# Patient Record
Sex: Female | Born: 1945 | Race: Black or African American | Hispanic: No | Smoking: Current some day smoker
Health system: Southern US, Community
[De-identification: ages and names within clinical notes are randomized; demographics above are authoritative.]

## PROBLEM LIST (undated history)

## (undated) DIAGNOSIS — I10 Essential (primary) hypertension: Principal | ICD-10-CM

## (undated) HISTORY — PX: ABDOMINAL HYSTERECTOMY: SHX81

## (undated) HISTORY — DX: Essential (primary) hypertension: I10

---

## 2013-04-11 ENCOUNTER — Ambulatory Visit (INDEPENDENT_AMBULATORY_CARE_PROVIDER_SITE_OTHER): Payer: Medicare Other | Admitting: Family Medicine

## 2013-04-11 ENCOUNTER — Encounter: Payer: Self-pay | Admitting: Family Medicine

## 2013-04-11 VITALS — BP 151/101 | HR 73 | Ht 64.0 in | Wt 158.0 lb

## 2013-04-11 DIAGNOSIS — F17200 Nicotine dependence, unspecified, uncomplicated: Secondary | ICD-10-CM

## 2013-04-11 DIAGNOSIS — E785 Hyperlipidemia, unspecified: Secondary | ICD-10-CM | POA: Insufficient documentation

## 2013-04-11 DIAGNOSIS — Z5181 Encounter for therapeutic drug level monitoring: Secondary | ICD-10-CM

## 2013-04-11 DIAGNOSIS — Z79899 Other long term (current) drug therapy: Secondary | ICD-10-CM

## 2013-04-11 DIAGNOSIS — R350 Frequency of micturition: Secondary | ICD-10-CM | POA: Insufficient documentation

## 2013-04-11 DIAGNOSIS — Z720 Tobacco use: Secondary | ICD-10-CM | POA: Insufficient documentation

## 2013-04-11 DIAGNOSIS — I10 Essential (primary) hypertension: Secondary | ICD-10-CM

## 2013-04-11 DIAGNOSIS — R05 Cough: Secondary | ICD-10-CM

## 2013-04-11 HISTORY — DX: Essential (primary) hypertension: I10

## 2013-04-11 MED ORDER — LISINOPRIL 5 MG PO TABS: 5.0000 mg | ORAL_TABLET | Freq: Every day | ORAL | Status: AC

## 2013-04-11 MED ORDER — TIOTROPIUM BROMIDE MONOHYDRATE 18 MCG IN CAPS: 18.0000 ug | ORAL_CAPSULE | Freq: Every day | RESPIRATORY_TRACT | Status: AC

## 2013-04-11 NOTE — Patient Instructions (Addendum)
Start the Spiriva samples and call me if this helps with your cough so I can send in a prescription to [facility].

## 2013-04-11 NOTE — Progress Notes (Signed)
CC: Melissa Miranda is a 67 y.o. female is here for Establish Care   Subjective: HPI:  Pleasant and animated 67 year old here to establish care  Patient reports a history of hypertension she has been on lisinopril 5 mg in the past she ran out of this medication one week ago. Records from [facility] show normotensive blood pressures while on this medication she has no outside blood pressures to report.  History of hyperlipidemia: She's been taking pravastatin for at least a year now last lipid panel in January had LDL of 150 while on this medication. She denies right upper quadrant pain, myalgias, nor skin or scleral discoloration  Patient complains of a cough that has been present on a daily basis but has not gotten better or worse since onset years ago. Symptoms seem to be worse when pollen counts are high nothing else particularly makes the cough better or worse. It is only present during waking hours it is described as productive without blood in sputum. She has been smoker for over 20 years she's unsure of the exact duration of her smoking habits. Cough did not improve after running out of lisinopril. She denies shortness of breath wheezing or chest pain  Patient complains of urinary frequency that has been present ever since hysterectomy for benign reasons one year ago. Urinary frequency has gotten from mild to moderate over the past week it is also accompanied by urinary urgency but without dysuria nor change in color or odor or consistency of urine. She denies flank pain, fevers, chills  Review of Systems - General ROS: negative for - chills, fever, night sweats, weight gain or weight loss Ophthalmic ROS: negative for - decreased vision Psychological ROS: negative for - anxiety or depression ENT ROS: negative for - hearing change, nasal congestion, tinnitus or allergies Hematological and Lymphatic ROS: negative for - bleeding problems, bruising or swollen lymph nodes Breast ROS:  negative Respiratory ROS: no shortness of breath, or wheezing Cardiovascular ROS: no chest pain or dyspnea on exertion Gastrointestinal ROS: no abdominal pain, change in bowel habits, or black or bloody stools Genito-Urinary ROS: negative for - genital discharge, genital ulcers, incontinence or abnormal bleeding from genitals Musculoskeletal ROS: negative for - joint pain or muscle pain Neurological ROS: negative for - headaches or memory loss Dermatological ROS: negative for lumps, mole changes, rash and skin lesion changes  Past Medical History  Diagnosis Date  . Essential hypertension, benign 04/11/2013     History reviewed. No pertinent family history.   History  Substance Use Topics  . Smoking status: Current Every Day Smoker    Types: Cigarettes  . Smokeless tobacco: Not on file  . Alcohol Use: Not on file     Objective: Filed Vitals:   04/11/13 1046  BP: 151/101  Pulse: 73    General: Alert and Oriented, No Acute Distress HEENT: Pupils equal, round, reactive to light. Conjunctivae clear.  External ears unremarkable, canals clear with intact TMs with appropriate landmarks.  Middle ear appears open without effusion. Pink inferior turbinates.  Moist mucous membranes, pharynx without inflammation nor lesions.  Neck supple without palpable lymphadenopathy nor abnormal masses. Lungs: Clear to auscultation bilaterally, no wheezing/ronchi/rales.  Comfortable work of breathing. Good air movement. Distant breath sounds in all lung fields Cardiac: Regular rate and rhythm. Normal S1/S2.  No murmurs, rubs, nor gallops.   Back: No CVA tenderness Extremities: No peripheral edema.  Strong peripheral pulses.  Mental Status: No depression, anxiety, nor agitation. Skin: Warm and dry.  Assessment & Plan: [name] was seen today for establish care.  Diagnoses and associated orders for this visit:  Essential hypertension, benign - lisinopril (PRINIVIL,ZESTRIL) 5 MG tablet; Take 1 tablet  (5 mg total) by mouth daily.  Hyperlipidemia - Lipid panel  Tobacco use  Urinary frequency - Urinalysis, Routine w reflex microscopic - Urine culture  Encounter for monitoring statin therapy - COMPLETE METABOLIC PANEL WITH GFR  Cough - tiotropium (SPIRIVA HANDIHALER) 18 MCG inhalation capsule; Place 1 capsule (18 mcg total) into inhaler and inhale daily.    Essential hypertension: Uncontrolled chronic condition restart lisinopril with blood pressure check in 2-4 weeks checking renal function today Hyperlipidemia: Uncontrolled based on last LDL in January repeat LDL fasting today we'll titrate Pravachol if needed, checking hepatic enzymes Tobacco use: Counseled patient on smoking cessation Urinary frequency: Rule out UTI versus glycosuria Cough: Low suspicion of infectious etiology, likely due to chronic bronchitis due to smoking samples of Spiriva given  Return in about 4 weeks (around 05/09/2013).

## 2013-04-12 ENCOUNTER — Telehealth: Payer: Self-pay | Admitting: Family Medicine

## 2013-04-12 DIAGNOSIS — E785 Hyperlipidemia, unspecified: Secondary | ICD-10-CM

## 2013-04-12 DIAGNOSIS — R739 Hyperglycemia, unspecified: Secondary | ICD-10-CM

## 2013-04-12 LAB — COMPLETE METABOLIC PANEL WITH GFR
AST: 21 U/L (ref 0–37)
Albumin: 4.2 g/dL (ref 3.5–5.2)
Alkaline Phosphatase: 89 U/L (ref 39–117)
BUN: 7 mg/dL (ref 6–23)
GFR, Est Non African American: 63 mL/min
Glucose, Bld: 101 mg/dL — ABNORMAL HIGH (ref 70–99)
Potassium: 4.3 mEq/L (ref 3.5–5.3)
Total Bilirubin: 0.6 mg/dL (ref 0.3–1.2)

## 2013-04-12 LAB — URINALYSIS, ROUTINE W REFLEX MICROSCOPIC
Bilirubin Urine: NEGATIVE
Hgb urine dipstick: NEGATIVE
Ketones, ur: NEGATIVE mg/dL
Nitrite: NEGATIVE
Protein, ur: NEGATIVE mg/dL
Urobilinogen, UA: 0.2 mg/dL (ref 0.0–1.0)
pH: 5 (ref 5.0–8.0)

## 2013-04-12 LAB — URINE CULTURE: Organism ID, Bacteria: NO GROWTH

## 2013-04-12 LAB — LIPID PANEL
Cholesterol: 186 mg/dL (ref 0–200)
HDL: 48 mg/dL (ref >39)
LDL Cholesterol: 111 mg/dL — ABNORMAL HIGH (ref 0–99)
Total CHOL/HDL Ratio: 3.9 Ratio
VLDL: 27 mg/dL (ref 0–40)

## 2013-04-12 LAB — URINALYSIS, MICROSCOPIC ONLY
Casts: NONE SEEN
Crystals: NONE SEEN

## 2013-04-12 MED ORDER — PRAVASTATIN SODIUM 80 MG PO TABS: 80.0000 mg | ORAL_TABLET | Freq: Every day | ORAL | Status: AC

## 2013-04-12 NOTE — Telephone Encounter (Signed)
Sue Lush, Will you please let Melissa Miranda know that her urinanalysis was normal however we won't have results about the culture until later this week.  Cholesterol was above a goal of an LDL cholesterol of less than 100 so I've sent in an increased dose of pravastatin to her [facility], we'll want to recheck cholesterol in three months.  Liver function and kidney function were normal, fasting blood sugar was just barely elevated, something that we'll check on periodically at follow up. I'd encourage her to f/u in 4 weeks for blood pressure recheck.

## 2013-04-12 NOTE — Telephone Encounter (Signed)
Pt.notified

## 2013-04-13 ENCOUNTER — Telehealth: Payer: Self-pay | Admitting: Family Medicine

## 2013-04-13 DIAGNOSIS — N3281 Overactive bladder: Secondary | ICD-10-CM

## 2013-04-13 MED ORDER — MIRABEGRON ER 25 MG PO TB24: 25.0000 mg | ORAL_TABLET | Freq: Every day | ORAL | Status: DC

## 2013-04-13 NOTE — Telephone Encounter (Signed)
Samples left up front and left message on pts vm

## 2013-04-13 NOTE — Telephone Encounter (Signed)
Melissa Miranda, Myrbetriq samples placed in your inbox for patient.

## 2013-05-10 ENCOUNTER — Ambulatory Visit (INDEPENDENT_AMBULATORY_CARE_PROVIDER_SITE_OTHER): Payer: Medicare Other | Admitting: Family Medicine

## 2013-05-10 ENCOUNTER — Encounter: Payer: Self-pay | Admitting: Family Medicine

## 2013-05-10 VITALS — BP 141/76 | HR 67 | Ht 64.0 in | Wt 159.0 lb

## 2013-05-10 DIAGNOSIS — Z8619 Personal history of other infectious and parasitic diseases: Secondary | ICD-10-CM | POA: Insufficient documentation

## 2013-05-10 DIAGNOSIS — Z13820 Encounter for screening for osteoporosis: Secondary | ICD-10-CM

## 2013-05-10 DIAGNOSIS — Z1239 Encounter for other screening for malignant neoplasm of breast: Secondary | ICD-10-CM

## 2013-05-10 DIAGNOSIS — R5383 Other fatigue: Secondary | ICD-10-CM

## 2013-05-10 DIAGNOSIS — Z0000 Encounter for general adult medical examination without abnormal findings: Secondary | ICD-10-CM

## 2013-05-10 DIAGNOSIS — R55 Syncope and collapse: Secondary | ICD-10-CM

## 2013-05-10 DIAGNOSIS — Z124 Encounter for screening for malignant neoplasm of cervix: Secondary | ICD-10-CM

## 2013-05-10 DIAGNOSIS — Z1211 Encounter for screening for malignant neoplasm of colon: Secondary | ICD-10-CM

## 2013-05-10 MED ORDER — ESTROGENS CONJUGATED 0.3 MG PO TABS: ORAL_TABLET | ORAL | Status: DC

## 2013-05-10 NOTE — Progress Notes (Signed)
Subjective:    Melissa Miranda is a 67 y.o. female who presents for Medicare Annual/Subsequent preventive examination.  Preventive Screening-Counseling & Management  Tobacco History  Smoking status  . Current Every Day Smoker  . Types: Cigarettes  Smokeless tobacco  . Not on file    Colonoscopy: Believes this was normal in 2013, 10 year clearance Papsmear: GYN told her she never needs this again since hysterectomy  Mammogram: Overdue for annual  DEXA: due for 2 year f/u unknown what last results were  Influenza Vaccine: declines Pneumovax: declines Td/Tdap: declines Zoster: declines      Problems Prior to Visit 1. Complaining of hot flashes most days of the week interefering with quality of life.  No history of blood clots.  Also complaining of fatigue for the past 2 months on a daily basis.  Current Problems (verified) Patient Active Problem List   Diagnosis Date Noted  . History of shingles 05/10/2013  . 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 Overactive bladder 04/13/2013  . Hyperglycemia 04/12/2013  . Cough 04/11/2013  . Essential hypertension, benign 04/11/2013  . Hyperlipidemia 04/11/2013  . Tobacco use 04/11/2013  . Urinary frequency 04/11/2013    Medications Prior to Visit Current Outpatient Prescriptions on File Prior to Visit  Medication Sig Dispense Refill  . Calcium Carbonate-Vitamin D (CALTRATE 600+D PO) Take by mouth.      Marland Kitchen lisinopril (PRINIVIL,ZESTRIL) 5 MG tablet Take 1 tablet (5 mg total) by mouth daily.  90 tablet  1  . mirabegron ER (MYRBETRIQ) 25 MG TB24 tablet Take 1 tablet (25 mg total) by mouth daily.  21 tablet  0  . pravastatin (PRAVACHOL) 80 MG tablet Take 1 tablet (80 mg total) by mouth daily.  30 tablet  3  . tiotropium (SPIRIVA HANDIHALER) 18 MCG inhalation capsule Place 1 capsule (18 mcg total) into inhaler and inhale daily.  15 capsule  0   No current facility-administered medications on file prior to visit.    Current Medications  (verified) Current Outpatient Prescriptions  Medication Sig Dispense Refill  . Calcium Carbonate-Vitamin D (CALTRATE 600+D PO) Take by mouth.      Marland Kitchen lisinopril (PRINIVIL,ZESTRIL) 5 MG tablet Take 1 tablet (5 mg total) by mouth daily.  90 tablet  1  . mirabegron ER (MYRBETRIQ) 25 MG TB24 tablet Take 1 tablet (25 mg total) by mouth daily.  21 tablet  0  . pravastatin (PRAVACHOL) 80 MG tablet Take 1 tablet (80 mg total) by mouth daily.  30 tablet  3  . tiotropium (SPIRIVA HANDIHALER) 18 MCG inhalation capsule Place 1 capsule (18 mcg total) into inhaler and inhale daily.  15 capsule  0  . estrogens, conjugated, (PREMARIN) 0.3 MG tablet One by mouth daily to control hot flashes.  30 tablet  4   No current facility-administered medications for this visit.     Allergies (verified) Review of patient's allergies indicates no known allergies.   PAST HISTORY  Family History Family History  Problem Relation Age of Onset  . Hypertension Father     Social History History  Substance Use Topics  . Smoking status: Current Every Day Smoker    Types: Cigarettes  . Smokeless tobacco: Not on file  . Alcohol Use: Not on file     Are there smokers in your home (other than you)? No  Risk Factors Current exercise habits: The patient does not participate in regular exercise at present.  Dietary issues discussed: DASH diet   Cardiac risk factors: advanced age (older  than 55 for men, 65 for women), dyslipidemia, hypertension, sedentary lifestyle and smoking/ tobacco exposure.  Depression Screen (Note: if answer to either of the following is "Yes", a more complete depression screening is indicated)   Over the past two weeks, have you felt down, depressed or hopeless? No  Over the past two weeks, have you felt little interest or pleasure in doing things? No  Have you lost interest or pleasure in daily life? No  Do you often feel hopeless? No  Do you cry easily over simple problems? No  Activities  of Daily Living In your present state of health, do you have any difficulty performing the following activities?:  Driving? No Managing money?  No Feeding yourself? No Getting from bed to chair? no Climbing a flight of stairs? No Preparing food and eating?: No Bathing or showering? No Getting dressed: No Getting to the toilet? No Using the toilet:No Moving around from place to place: No In the past year have you fallen or had a near fall?:No   Are you sexually active?  No  Do you have more than one partner?  No  Hearing Difficulties: No Do you often ask people to speak up or repeat themselves? No Do you experience ringing or noises in your ears? No Do you have difficulty understanding soft or whispered voices? No   Do you feel that you have a problem with memory? No  Do you often misplace items? No  Do you feel safe at home?  Yes  Cognitive Testing  Alert? Yes  Normal Appearance?Yes  Oriented to person? Yes  Place? Yes   Time? Yes  Recall of three objects?  Yes  Can perform simple calculations? Yes  Displays appropriate judgment?Yes  Can read the correct time from a watch face?Yes   Advanced Directives have been discussed with the patient? Yes  List the Names of Other Physician/Practitioners you currently use: 1.  none  Indicate any recent Medical Services you may have received from other than [facility] providers in the past year (date may be approximate).   There is no immunization history on file for this patient.  Screening Tests Health Maintenance  Topic Date Due  . Tetanus/tdap  11/30/1965  . Mammogram  11/30/1996  . Colonoscopy  11/30/1996  . Zostavax  12/01/2006  . Pneumococcal Polysaccharide Vaccine Age 66 And Over  12/01/2011  . Influenza Vaccine  02/11/2013    All answers were reviewed with the patient and necessary referrals were made:  Laren Boom, DO   05/10/2013   History reviewed: allergies, current medications, past family history, past medical  history, past social history, past surgical history and problem list  Review of Systems Review of Systems - General ROS: negative for - night sweats, weight gain or weight loss Ophthalmic ROS: negative for - decreased vision Psychological ROS: negative for - anxiety or depression ENT ROS: negative for - hearing change, nasal congestion, tinnitus or allergies Hematological and Lymphatic ROS: negative for - bleeding problems, bruising or swollen lymph nodes Breast ROS: negative Respiratory ROS: no cough, shortness of breath, or wheezing Cardiovascular ROS: no chest pain or dyspnea on exertion Gastrointestinal ROS: no abdominal pain, change in bowel habits, or black or bloody stools Genito-Urinary ROS: negative for - genital discharge, genital ulcers, incontinence or abnormal bleeding from genitals Musculoskeletal ROS: negative for - joint pain or muscle pain Neurological ROS: negative for - headaches or memory loss Dermatological ROS: negative for lumps, mole changes, rash and skin lesion changes  Objective:     Vision by Snellen chart: right eye:20/30, left eye:20/40  Body mass index is 27.28 kg/(m^2). BP 141/76  Pulse 67  Ht 5\' 4"  (1.626 m)  Wt 159 lb (72.122 kg)  BMI 27.28 kg/m2  Vital signs reviewed. General: Alert and Oriented, No Acute Distress HEENT: Pupils equal, round, reactive to light. Conjunctivae clear.  External ears unremarkable.  Moist mucous membranes. Lungs: Clear and comfortable work of breathing, speaking in full sentences without accessory muscle use. Cardiac: Regular rate and rhythm.  Neuro: CN II-XII grossly intact, gait normal. Extremities: Noand and peripheral edema.  Strong peripheral pulses.  Mental Status: No depression, anxiety, nor agitation. Logical though process. Skin: Warm and dry.     Assessment:     Vasomotor instability and mild vision loss      Plan:     During the course of the visit the patient was educated and counseled about  appropriate screening and preventive services including:    Pneumococcal vaccine   Influenza vaccine  Td vaccine  Screening mammography  Screening Pap smear and pelvic exam   Bone densitometry screening  Colorectal cancer screening  Nutrition counseling   Diet review for nutrition referral? No  Patient Instructions (the written plan) was given to the patient.  Medicare Attestation I have personally reviewed: The patient's medical and social history Their use of alcohol, tobacco or illicit drugs Their current medications and supplements The patient's functional ability including ADLs,fall risks, home safety risks, cognitive, and hearing and visual impairment Diet and physical activities Evidence for depression or mood disorders  The patient's weight, height, BMI, and visual acuity have been recorded in the chart.  I have made referrals, counseling, and provided education to the patient based on review of the above and I have provided the patient with a written personalized care plan for preventive services.    Start Premarin and checking TSH today, encouraged followup with optometry for formal vision testing  Return in 2 months  Laren Boom, DO   05/10/2013

## 2013-05-10 NOTE — Patient Instructions (Signed)
Dr. Meko Bellanger's General Advice Following Your Complete Physical Exam  The Benefits of Regular Exercise: Unless you suffer from an uncontrolled cardiovascular condition, studies strongly suggest that regular exercise and physical activity will add to both the quality and length of your life.  The World Health Organization recommends 150 minutes of moderate intensity aerobic activity every week.  This is best split over 3-4 days a week, and can be as simple as a brisk walk for just over 35 minutes "most days of the week".  This type of exercise has been shown to lower LDL-Cholesterol, lower average blood sugars, lower blood pressure, lower cardiovascular disease risk, improve memory, and increase one's overall sense of wellbeing.  The addition of anaerobic (or "strength training") exercises offers additional benefits including but not limited to increased metabolism, prevention of osteoporosis, and improved overall cholesterol levels.  How Can I Strive For A Low-Fat Diet?: Current guidelines recommend that 25-35 percent of your daily energy (food) intake should come from fats.  One might ask how can this be achieved without having to dissect each meal on a daily basis?  Switch to skim or 1% milk instead of whole milk.  Focus on lean meats such as ground turkey, fresh fish, baked chicken, and lean cuts of beef as your source of dietary protein.  Consume less than 300mg/day of dietary cholesterol.  Limit trans fatty acid consumption primarily by limiting synthetic trans fats such as partially hydrogenated oils (Ex: fried fast foods).  Focus efforts on reducing your intake of "solid" fats (Ex: Butter).  Substitute olive or vegetable oil for solid fats where possible.  Moderation of Salt Intake: Provided you don't carry a diagnosis of congestive heart failure nor renal failure, I recommend a daily allowance of no more than 2300 mg of salt (sodium).  Keeping under this daily goal is associated with a  decreased risk of cardiovascular events, creeping above it can lead to elevated blood pressures and increases your risk of cardiovascular events.  Milligrams (mg) of salt is listed on all nutrition labels, and your daily intake can add up faster than you think.  Most canned and frozen dinners can pack in over half your daily salt allowance in one meal.    Lifestyle Health Risks: Certain lifestyle choices carry specific health risks.  As you may already know, tobacco use has been associated with increasing one's risk of cardiovascular disease, pulmonary disease, numerous cancers, among many other issues.  What you may not know is that there are medications and nicotine replacement strategies that can more than double your chances of successfully quitting.  I would be thrilled to help manage your quitting strategy if you currently use tobacco products.  When it comes to alcohol use, I've yet to find an "ideal" daily allowance.  Provided an individual does not have a medical condition that is exacerbated by alcohol consumption, general guidelines determine "safe drinking" as no more than two standard drinks for a man or no more than one standard drink for a female per day.  However, much debate still exists on whether any amount of alcohol consumption is technically "safe".  My general advice, keep alcohol consumption to a minimum for general health promotion.  If you or others believe that alcohol, tobacco, or recreational drug use is interfering with your life, I would be happy to provide confidential counseling regarding treatment options.  General "Over The Counter" Nutrition Advice: Postmenopausal women should aim for a daily calcium intake of 1200 mg, however a significant   portion of this might already be provided by diets including milk, yogurt, cheese, and other dairy products.  Vitamin D has been shown to help preserve bone density, prevent fatigue, and has even been shown to help reduce falls in the  elderly.  Ensuring a daily intake of 800 Units of Vitamin D is a good place to start to enjoy the above benefits, we can easily check your Vitamin D level to see if you'd potentially benefit from supplementation beyond 800 Units a day.  Folic Acid intake should be of particular concern to women of childbearing age.  Daily consumption of 400-800 mcg of Folic Acid is recommended to minimize the chance of spinal cord defects in a fetus should pregnancy occur.    For many adults, accidents still remain one of the most common culprits when it comes to cause of death.  Some of the simplest but most effective preventitive habits you can adopt include regular seatbelt use, proper helmet use, securing firearms, and regularly testing your smoke and carbon monoxide detectors.  Melissa Miranda B. Melissa Dant DO [facility] [address], [address] [place], [place] [address] Phone: [phone]  

## 2013-05-11 LAB — TSH: TSH: 0.665 u[IU]/mL (ref 0.350–4.500)

## 2013-05-24 ENCOUNTER — Ambulatory Visit (INDEPENDENT_AMBULATORY_CARE_PROVIDER_SITE_OTHER): Payer: Medicare Other

## 2013-05-24 DIAGNOSIS — M899 Disorder of bone, unspecified: Secondary | ICD-10-CM

## 2013-05-24 DIAGNOSIS — Z124 Encounter for screening for malignant neoplasm of cervix: Secondary | ICD-10-CM

## 2013-05-24 DIAGNOSIS — Z13820 Encounter for screening for osteoporosis: Secondary | ICD-10-CM

## 2013-05-24 DIAGNOSIS — Z1231 Encounter for screening mammogram for malignant neoplasm of breast: Secondary | ICD-10-CM

## 2013-05-25 ENCOUNTER — Encounter: Payer: Self-pay | Admitting: Family Medicine

## 2013-05-25 DIAGNOSIS — M8580 Other specified disorders of bone density and structure, unspecified site: Secondary | ICD-10-CM | POA: Insufficient documentation

## 2013-11-15 ENCOUNTER — Ambulatory Visit: Payer: Medicare Other | Admitting: Family Medicine

## 2013-11-18 ENCOUNTER — Ambulatory Visit (INDEPENDENT_AMBULATORY_CARE_PROVIDER_SITE_OTHER): Payer: Medicare Other

## 2013-11-18 ENCOUNTER — Ambulatory Visit (INDEPENDENT_AMBULATORY_CARE_PROVIDER_SITE_OTHER): Payer: Medicare Other | Admitting: Family Medicine

## 2013-11-18 ENCOUNTER — Other Ambulatory Visit: Payer: Self-pay | Admitting: Family Medicine

## 2013-11-18 ENCOUNTER — Encounter: Payer: Self-pay | Admitting: Family Medicine

## 2013-11-18 VITALS — BP 121/56 | HR 101 | Wt 136.0 lb

## 2013-11-18 DIAGNOSIS — J302 Other seasonal allergic rhinitis: Secondary | ICD-10-CM

## 2013-11-18 DIAGNOSIS — J309 Allergic rhinitis, unspecified: Secondary | ICD-10-CM

## 2013-11-18 DIAGNOSIS — R05 Cough: Secondary | ICD-10-CM

## 2013-11-18 DIAGNOSIS — R0789 Other chest pain: Secondary | ICD-10-CM

## 2013-11-18 DIAGNOSIS — IMO0001 Reserved for inherently not codable concepts without codable children: Secondary | ICD-10-CM

## 2013-11-18 DIAGNOSIS — R0602 Shortness of breath: Secondary | ICD-10-CM

## 2013-11-18 DIAGNOSIS — N318 Other neuromuscular dysfunction of bladder: Secondary | ICD-10-CM

## 2013-11-18 DIAGNOSIS — R059 Cough, unspecified: Secondary | ICD-10-CM

## 2013-11-18 DIAGNOSIS — N3281 Overactive bladder: Secondary | ICD-10-CM

## 2013-11-18 DIAGNOSIS — R1110 Vomiting, unspecified: Secondary | ICD-10-CM

## 2013-11-18 MED ORDER — CETIRIZINE HCL 10 MG PO TABS: 10.0000 mg | ORAL_TABLET | Freq: Every day | ORAL | Status: AC

## 2013-11-18 NOTE — Progress Notes (Signed)
CC: Melissa Miranda is a 68 y.o. female is here for swallowing issues   Subjective: HPI:  Complains of cough that has been present off and on for about a year now. Symptoms have been getting worse since September. Described as productive without blood and sputum present all hours of the day and interfering with sleep. Slightly improved with Spiriva, no benefit from over-the-counter cough medicines. No other interventions. Accompanied by shortness of breath with exertion or when she gets in arguments. She's also lost about 15 pounds unintentionally since I saw her last. She continues to smoke however it sounds like only one cigarette a day now. She denies fevers, chills, chest pain, orthopnea, peripheral edema.  Complains of regurgitation further described as food getting caught in her throat. She has a history of esophageal stricture that require stretching years ago. Symptoms are mild to moderate in severity present with solids and liquids. Nothing particularly makes better or worse. Symptoms occur on a daily basis. Denies pain with swallowing.  Complains of nasal congestion and facial pressure localized beneath both eyes and has been present off and on for the past month. Accompanied by subjective postnasal drip.  No interventions as of yet. Denies headache, motor sensory disturbances, nor vision loss.  Complains of continued frequent urination described as frequency and urgency without dysuria symptoms have not gotten better or worse since trying mybetriq.  She's requesting referral to urology  Review Of Systems Outlined In HPI  Past Medical History  Diagnosis Date  . Essential hypertension, benign 04/11/2013    Past Surgical History  Procedure Laterality Date  . Abdominal hysterectomy     Family History  Problem Relation Age of Onset  . Hypertension Father     History   Social History  . Marital Status: Unknown    Spouse Name: N/A    Number of Children: N/A  . Years of Education:  N/A   Occupational History  . Not on file.   Social History Main Topics  . Smoking status: Current Some Day Smoker    Types: Cigarettes  . Smokeless tobacco: Not on file  . Alcohol Use: Not on file  . Drug Use: Not on file  . Sexual Activity: Not on file   Other Topics Concern  . Not on file   Social History Narrative  . No narrative on file     Objective: BP 121/56  Pulse 101  Wt 136 lb (61.689 kg)  SpO2 96%  General: Alert and Oriented, No Acute Distress HEENT: Pupils equal, round, reactive to light. Conjunctivae clear.  External ears unremarkable, canals clear with intact TMs with appropriate landmarks.  Middle ear appears open without effusion. Pink inferior turbinates.  Moist mucous membranes, pharynx without inflammation nor lesions.  Neck supple without palpable lymphadenopathy nor abnormal masses. Lungs: Distant breath sounds Clear to auscultation bilaterally, no wheezing/ronchi/rales.  Comfortable work of breathing. Good air movement. Cardiac: Regular rate and rhythm. Normal S1/S2.  No murmurs, rubs, nor gallops.   Mental Status: No depression, anxiety, nor agitation. Skin: Warm and dry.  Assessment & Plan: Melissa Miranda was seen today for swallowing issues.  Diagnoses and associated orders for this visit:  Seasonal allergies - cetirizine (ZYRTEC) 10 MG tablet; Take 1 tablet (10 mg total) by mouth daily.  Cough - DG Chest 2 View; Future  Shortness of breath  Regurgitation - DG Esophagus; Future  Overactive bladder - Ambulatory referral to Urology    Seasonal allergies: Start Zyrtec as needed Cough: Checking chest x-ray, concerning  giving her unintentional weight loss. If unremarkable we'll treat his chronic bronchitis Regurgitation: She needs an esophagram, swallow study has been ordered Overactive bladder: Urology referral    Return if symptoms worsen or fail to improve.

## 2013-11-23 ENCOUNTER — Ambulatory Visit (INDEPENDENT_AMBULATORY_CARE_PROVIDER_SITE_OTHER): Payer: Medicare Other | Admitting: Family Medicine

## 2013-11-23 ENCOUNTER — Encounter: Payer: Self-pay | Admitting: Family Medicine

## 2013-11-23 VITALS — BP 132/71 | HR 96 | Wt 136.0 lb

## 2013-11-23 DIAGNOSIS — R053 Chronic cough: Secondary | ICD-10-CM

## 2013-11-23 DIAGNOSIS — R05 Cough: Secondary | ICD-10-CM

## 2013-11-23 DIAGNOSIS — J449 Chronic obstructive pulmonary disease, unspecified: Secondary | ICD-10-CM

## 2013-11-23 MED ORDER — BUDESONIDE-FORMOTEROL FUMARATE 160-4.5 MCG/ACT IN AERO
2.0000 | INHALATION_SPRAY | Freq: Two times a day (BID) | RESPIRATORY_TRACT | Status: AC
Start: 2013-11-23 — End: ?

## 2013-11-23 MED ORDER — ALBUTEROL SULFATE HFA 108 (90 BASE) MCG/ACT IN AERS: INHALATION_SPRAY | RESPIRATORY_TRACT | Status: AC

## 2013-11-23 NOTE — Progress Notes (Signed)
CC: Melissa Miranda is a 67 y.o. female is here for No chief complaint on file.   Subjective: HPI:  Followup of chronic cough: Patient states she has had a productive cough that has been present for matter of decades. Describes as mild in severity at rest but moderate in severity when she gets anxious or has to talk quickly. Symptoms are present all hours of the day but denies nocturnal symptoms awakening from sleep. Has not been accompanied by fevers, chills, wheezing, nor blood and sputum. Symptoms overall have been getting worse since I met her in September of 2014.  Has been accompanied by mild shortness of breath only with exertion.  Denies chest pain, irregular heartbeat, peripheral edema, nor motor or sensory disturbances. Interventions have included Spiriva with mild improvement, great subjective improvement from albuterol.   Review Of Systems Outlined In HPI  Past Medical History  Diagnosis Date  . Essential hypertension, benign 04/11/2013    Past Surgical History  Procedure Laterality Date  . Abdominal hysterectomy     Family History  Problem Relation Age of Onset  . Hypertension Father     History   Social History  . Marital Status: Unknown    Spouse Name: N/A    Number of Children: N/A  . Years of Education: N/A   Occupational History  . Not on file.   Social History Main Topics  . Smoking status: Current Some Day Smoker    Types: Cigarettes  . Smokeless tobacco: Not on file  . Alcohol Use: Not on file  . Drug Use: Not on file  . Sexual Activity: Not on file   Other Topics Concern  . Not on file   Social History Narrative  . No narrative on file     Objective: BP 132/71  Pulse 96  Wt 136 lb (61.689 kg)  SpO2 100%  General: Alert and Oriented, No Acute Distress HEENT: Pupils equal, round, reactive to light. Conjunctivae clear.  Lungs: Clear to auscultation bilaterally, no wheezing/ronchi/rales.  Comfortable work of breathing. Good air  movement. Cardiac: Regular rate and rhythm. Normal S1/S2.  No murmurs, rubs, nor gallops.   Extremities: No peripheral edema.  Strong peripheral pulses.  Mental Status: No depression, anxiety, nor agitation. Skin: Warm and dry.  Assessment & Plan: Melissa Miranda was seen today for no specified reason.  Diagnoses and associated orders for this visit:  COPD, moderate - albuterol (PROVENTIL HFA;VENTOLIN HFA) 108 (90 BASE) MCG/ACT inhaler; Inhale two puffs every 4-6 hours only as needed for shortness of breath or wheezing. - budesonide-formoterol (SYMBICORT) 160-4.5 MCG/ACT inhaler; Inhale 2 puffs into the lungs 2 (two) times daily.  Chronic cough    Prebronchodilator FEV1 percent 64% FVC 2.83, FEV1 1.8 Postbronchodilator FEV1 percent 80%, FVC 2.75, FEV1 2.28  FEV1 is below goal criteria of less than 70% and FEV1 increases greater than 200 mL after bronchodilation. Classified as moderate COPD with symptoms still persistent despite Spiriva and albuterol therefore start Symbicort, one-month sample was provided along with refills. I've asked her to followup with me in one month encouraged her to quit smoking.  25 minutes spent face-to-face during visit today of which at least 50% was counseling or coordinating care regarding: 1. COPD, moderate   2. Chronic cough       Return in about 4 weeks (around 12/21/2013).  

## 2013-12-09 ENCOUNTER — Encounter: Payer: Self-pay | Admitting: Family Medicine

## 2013-12-13 ENCOUNTER — Telehealth: Payer: Self-pay | Admitting: Family Medicine

## 2013-12-13 NOTE — Telephone Encounter (Signed)
Opened for care everywhere 

## 2014-01-11 DEATH — deceased

## 2014-01-23 ENCOUNTER — Telehealth: Payer: Self-pay

## 2014-01-23 NOTE — Telephone Encounter (Signed)
[facility] Ileene [facility]insonbit[facility]

## 2014-03-06 ENCOUNTER — Telehealth: Payer: Self-pay | Admitting: Family Medicine

## 2014-03-06 NOTE — Telephone Encounter (Signed)
Opened to complete [employer])

## 2015-02-08 IMAGING — DX DG DXA BONE DENSITY STUDY HL7
2 series · 2 of 2 positions shown · non-contrast
Comparison: None.

CLINICAL DATA: Postmenopausal. 67-year-old postmenopausal Caucasian
female. No personal fragility fracture history.

EXAM:
DUAL X-RAY ABSORPTIOMETRY (DXA) FOR BONE MINERAL DENSITY

[view not recorded (1 of 2)]
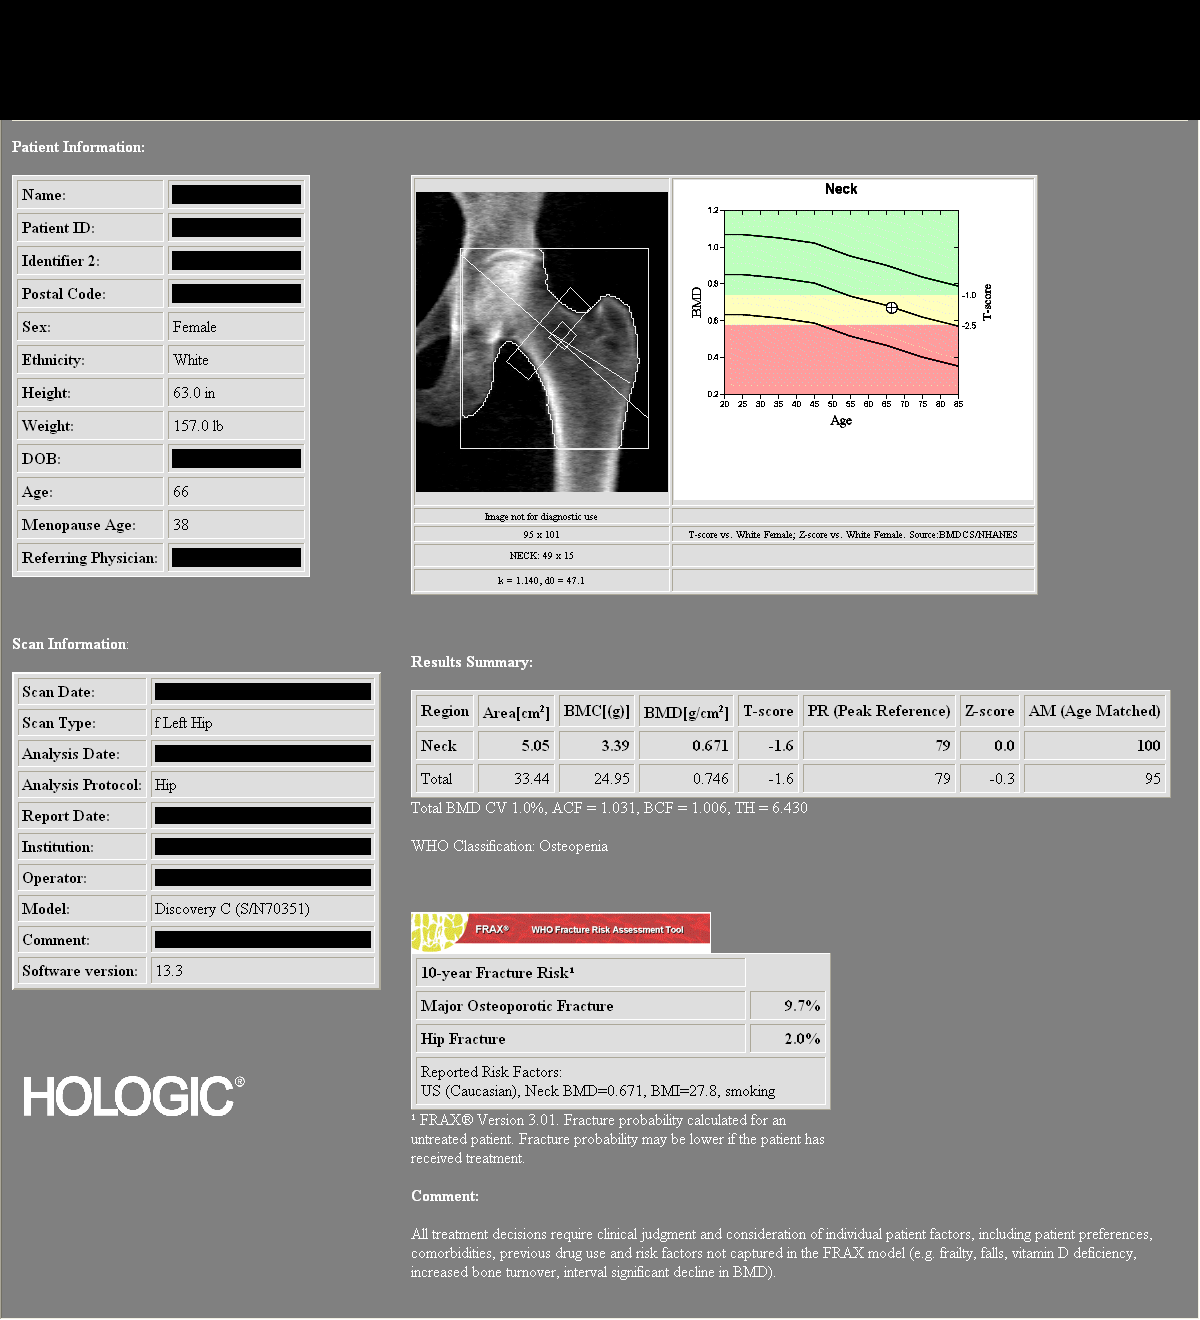

[view not recorded (2 of 2)]
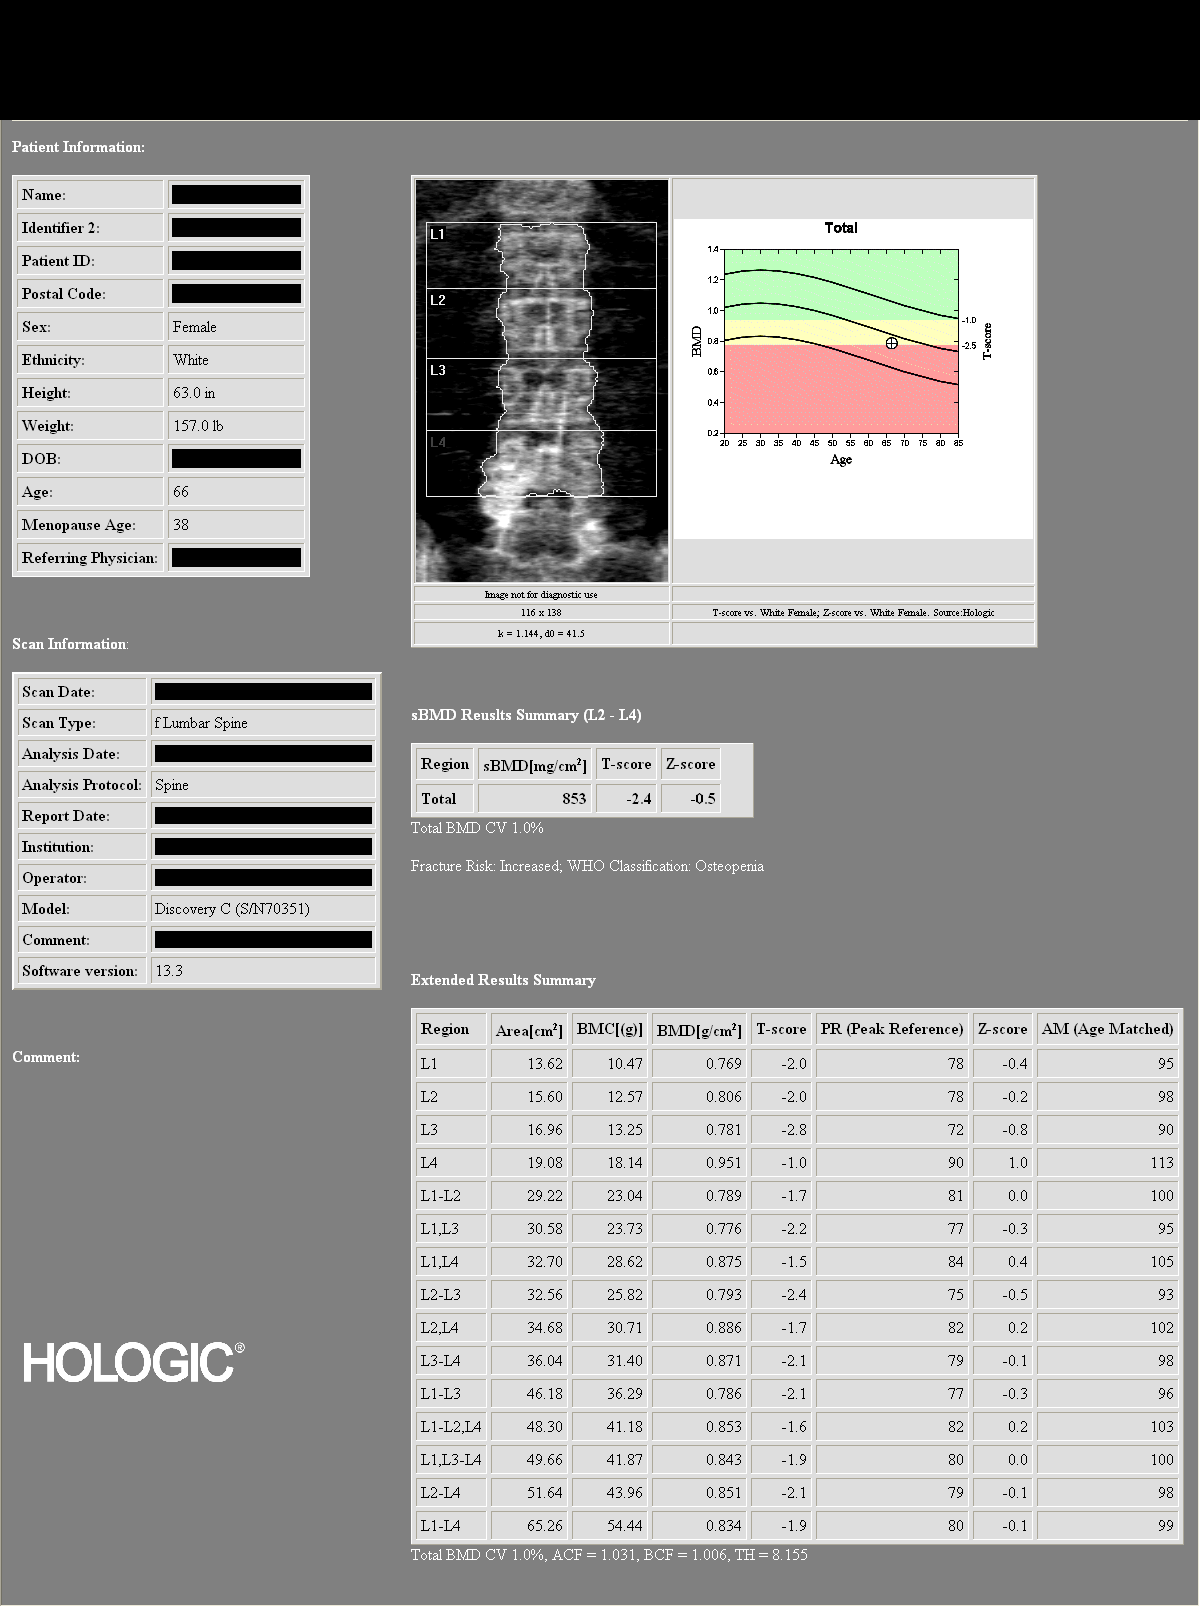

[2 of 2 positions shown; findings below may reference images not displayed]

FINDINGS: AP LUMBAR SPINE

Bone Mineral Density (BMD):  0.853 g/cm2

Young Adult T Score:  -2.4

Z Score:  -0.5

Left FEMUR neck

Bone Mineral Density (BMD):  0.671 g/cm2

Young Adult T Score: -1.6

Z Score:

ASSESSMENT: Patient's diagnostic category is low bone mass by WHO
Criteria.

FRACTURE RISK: Increased

FRAX:

Based on the World Health Organization FRAX model, the 10 year
probability of a major osteoporotic fracture is 9.7%. The 10 year
probability of a hip fracture is 2.0%.

RECOMMENDATIONS:
Effective therapies are available in the form of bisphosphonates,
selective estrogen receptor modulators, biologic agents, and hormone
replacement therapy (for women). All patients should ensure an
adequate intake of dietary calcium (1200mg daily) and vitamin D (800
Aiv Ising) unless contraindicated.

All treatment decisions require clinical judgement and consideration
of individual patient factors, including patient preferences,
co-morbidities, previous drug use, risk factors not captured in the
FRAX model (e.g., frailty, falls, vitamin D deficiency, increased
bone turnover, interval significant decline in bone density) and
possible under-or over-estimation of fracture risk by FRAX.

The National Osteoporosis Foundation recommends that FDA-approved
medical

therapies be considered in postmenopausal women and mean age 50 or
older with a:

Effective therapies are available in the form of bisphosphonates,
selective estrogen receptor modulators, biologic agents, and hormone
replacement therapy (for women). All patients should ensure an
adequate intake of dietary calcium (1200mg daily) and vitamin D (800
Aiv Ising) unless contraindicated.

All treatment decisions require clinical judgement and consideration
of individual patient factors, including patient preferences,
co-morbidities, previous drug use, risk factors not captured in the
FRAX model (e.g., frailty, falls, vitamin D deficiency, increased
bone turnover, interval significant decline in bone density) and
possible under-or over-estimation of fracture risk by FRAX.

The National Osteoporosis Foundation recommends that FDA-approved
medical

therapies be considered in postmenopausal women and mean age 50 or
older with a:

1. Hip or vertebral (clinical or morphometric) fracture.

2. T-score of -2.5 or lower at the spine or hip.

3. Ten-year fracture probability by FRAX of 3% or greater for hip
fracture or 20% or greater for major osteoporotic fracture.

FOLLOW UP:  People with diagnosed cases of osteoporosis or at high
risk for fracture should have regular bone mineral density tests.
For patients eligible for Medicare, routine testing is allowed once
every 2 years. The testing frequency can be increased to one year
for patients who have rapidly progressing disease, those who are
receiving or discontinuing medical therapy to restore bone mass, or
have additional risk factors.

World Health Organization (WHO) Criteria:

Normal: T scores from +1.0 to -1.0

Low Bone Mass (Osteopenia): T scores between -1.0 and -2.5

Osteoporosis: T scores -2.5 and below

Comparison to Reference Population:

T score is the key measure used in the diagnosis of osteoporosis and
relative risk determination for fracture. It provides a value for
bone mass relative to the mean bone mass of a young adult reference
population expressed in terms of standard deviation (SD).

Z score is the age-matched score showing the patient's values
compared to a population matched for age, sex, and race. This is
also expressed in terms of standard deviation. The patient may have
values that compare favorably to the age-matched values and still be
at increased risk for fracture.
